# Patient Record
Sex: Female | Born: 1940 | Race: Asian | Hispanic: No | Marital: Married | Smoking: Never smoker
Health system: Southern US, Community
[De-identification: ages and names within clinical notes are randomized; demographics above are authoritative.]

## PROBLEM LIST (undated history)

## (undated) DIAGNOSIS — I10 Essential (primary) hypertension: Secondary | ICD-10-CM

## (undated) HISTORY — PX: APPENDECTOMY: SHX54

---

## 2008-06-05 ENCOUNTER — Emergency Department (HOSPITAL_COMMUNITY): Admission: EM | Admit: 2008-06-05 | Discharge: 2008-06-05 | Payer: Self-pay | Admitting: Emergency Medicine

## 2008-07-16 ENCOUNTER — Emergency Department (HOSPITAL_COMMUNITY): Admission: EM | Admit: 2008-07-16 | Discharge: 2008-07-16 | Payer: Self-pay | Admitting: *Deleted

## 2008-08-09 ENCOUNTER — Emergency Department (HOSPITAL_COMMUNITY): Admission: EM | Admit: 2008-08-09 | Discharge: 2008-08-09 | Payer: Self-pay | Admitting: Emergency Medicine

## 2008-09-21 ENCOUNTER — Emergency Department (HOSPITAL_COMMUNITY): Admission: EM | Admit: 2008-09-21 | Discharge: 2008-09-21 | Payer: Self-pay | Admitting: Emergency Medicine

## 2008-09-24 ENCOUNTER — Encounter: Admission: RE | Admit: 2008-09-24 | Discharge: 2008-09-24 | Payer: Self-pay | Admitting: Pulmonary Disease

## 2010-09-05 LAB — URINALYSIS, ROUTINE W REFLEX MICROSCOPIC
Hgb urine dipstick: NEGATIVE
Protein, ur: NEGATIVE mg/dL
Specific Gravity, Urine: 1.004 — ABNORMAL LOW (ref 1.005–1.030)
Urobilinogen, UA: 0.2 mg/dL (ref 0.0–1.0)

## 2010-09-05 LAB — LIPASE, BLOOD: Lipase: 21 U/L (ref 11–59)

## 2010-09-05 LAB — CBC
HCT: 41.4 % (ref 36.0–46.0)
Hemoglobin: 13.6 g/dL (ref 12.0–15.0)
MCHC: 33 g/dL (ref 30.0–36.0)
MCV: 90.1 fL (ref 78.0–100.0)
RDW: 12.8 % (ref 11.5–15.5)

## 2010-09-05 LAB — POCT I-STAT, CHEM 8
BUN: 10 mg/dL (ref 6–23)
Creatinine, Ser: 0.8 mg/dL (ref 0.4–1.2)
Hemoglobin: 15 g/dL (ref 12.0–15.0)
Potassium: 3 mEq/L — ABNORMAL LOW (ref 3.5–5.1)
Sodium: 139 mEq/L (ref 135–145)

## 2010-09-05 LAB — DIFFERENTIAL
Lymphs Abs: 0.8 10*3/uL (ref 0.7–4.0)
Monocytes Relative: 3 % (ref 3–12)
Neutro Abs: 6.1 10*3/uL (ref 1.7–7.7)
Neutrophils Relative %: 81 % — ABNORMAL HIGH (ref 43–77)

## 2010-09-05 LAB — COMPREHENSIVE METABOLIC PANEL
BUN: 10 mg/dL (ref 6–23)
CO2: 24 mEq/L (ref 19–32)
Calcium: 9.3 mg/dL (ref 8.4–10.5)
Creatinine, Ser: 0.82 mg/dL (ref 0.4–1.2)
GFR calc non Af Amer: >60 mL/min (ref >60)
Glucose, Bld: 194 mg/dL — ABNORMAL HIGH (ref 70–99)
Total Protein: 7.5 g/dL (ref 6.0–8.3)

## 2011-09-11 ENCOUNTER — Encounter (HOSPITAL_COMMUNITY): Payer: Self-pay | Admitting: Emergency Medicine

## 2011-09-11 ENCOUNTER — Emergency Department (HOSPITAL_COMMUNITY)
Admission: EM | Admit: 2011-09-11 | Discharge: 2011-09-12 | Disposition: A | Discharge status: Home / Self Care | Payer: Medicaid Other | Attending: Emergency Medicine | Admitting: Emergency Medicine

## 2011-09-11 DIAGNOSIS — R112 Nausea with vomiting, unspecified: Secondary | ICD-10-CM | POA: Insufficient documentation

## 2011-09-11 DIAGNOSIS — R42 Dizziness and giddiness: Secondary | ICD-10-CM | POA: Insufficient documentation

## 2011-09-11 DIAGNOSIS — R51 Headache: Secondary | ICD-10-CM | POA: Insufficient documentation

## 2011-09-11 HISTORY — DX: Essential (primary) hypertension: I10

## 2011-09-11 LAB — CBC
HCT: 38.9 % (ref 36.0–46.0)
Hemoglobin: 13.1 g/dL (ref 12.0–15.0)
MCH: 29.8 pg (ref 26.0–34.0)
MCHC: 33.7 g/dL (ref 30.0–36.0)
MCV: 88.4 fL (ref 78.0–100.0)
Platelets: 276 10*3/uL (ref 150–400)
RBC: 4.4 MIL/uL (ref 3.87–5.11)
RDW: 12.5 % (ref 11.5–15.5)
WBC: 10.6 10*3/uL — ABNORMAL HIGH (ref 4.0–10.5)

## 2011-09-11 LAB — DIFFERENTIAL
Basophils Absolute: 0 10*3/uL (ref 0.0–0.1)
Basophils Relative: 0 % (ref 0–1)
Eosinophils Absolute: 0 10*3/uL (ref 0.0–0.7)
Eosinophils Relative: 0 % (ref 0–5)
Lymphocytes Relative: 10 % — ABNORMAL LOW (ref 12–46)
Lymphs Abs: 1.1 10*3/uL (ref 0.7–4.0)
Monocytes Absolute: 0.2 10*3/uL (ref 0.1–1.0)
Monocytes Relative: 2 % — ABNORMAL LOW (ref 3–12)
Neutro Abs: 9.3 10*3/uL — ABNORMAL HIGH (ref 1.7–7.7)
Neutrophils Relative %: 88 % — ABNORMAL HIGH (ref 43–77)

## 2011-09-11 LAB — POCT I-STAT, CHEM 8
BUN: 15 mg/dL (ref 6–23)
Creatinine, Ser: 0.6 mg/dL (ref 0.50–1.10)
Potassium: 3.6 mEq/L (ref 3.5–5.1)
Sodium: 139 mEq/L (ref 135–145)
TCO2: 23 mmol/L (ref 0–100)

## 2011-09-11 MED ORDER — DIAZEPAM 5 MG/ML IJ SOLN
5.0000 mg | Freq: Once | INTRAMUSCULAR | Status: AC
  Administered 2011-09-12: 5 mg via INTRAVENOUS
  Filled 2011-09-11: qty 2

## 2011-09-11 MED ORDER — PROMETHAZINE HCL 25 MG/ML IJ SOLN
12.5000 mg | Freq: Once | INTRAMUSCULAR | Status: AC
  Administered 2011-09-12: 12.5 mg via INTRAVENOUS
  Filled 2011-09-11: qty 1

## 2011-09-11 MED ORDER — SODIUM CHLORIDE 0.9 % IV BOLUS (SEPSIS)
1000.0000 mL | Freq: Once | INTRAVENOUS | Status: AC
  Administered 2011-09-11: 1000 mL via INTRAVENOUS

## 2011-09-11 MED ORDER — MECLIZINE HCL 25 MG PO TABS
25.0000 mg | ORAL_TABLET | Freq: Once | ORAL | Status: AC
  Administered 2011-09-12: 25 mg via ORAL
  Filled 2011-09-11: qty 1

## 2011-09-11 NOTE — ED Notes (Addendum)
Patient complaining of headache, dizziness, nausea, and vomiting since 1200 this afternoon; last emesis around 20 minutes ago.  Denies any weakness, but reports fatigue.  Patient does not speak [place]; family members at bedside to translate.  Translator phones at bedside.

## 2011-09-12 LAB — URINE MICROSCOPIC-ADD ON

## 2011-09-12 LAB — URINALYSIS, ROUTINE W REFLEX MICROSCOPIC
Bilirubin Urine: NEGATIVE
Glucose, UA: NEGATIVE mg/dL
Hgb urine dipstick: NEGATIVE
Ketones, ur: NEGATIVE mg/dL
Nitrite: NEGATIVE
Protein, ur: NEGATIVE mg/dL
Specific Gravity, Urine: 1.012 (ref 1.005–1.030)
Urobilinogen, UA: 0.2 mg/dL (ref 0.0–1.0)
pH: 8.5 — ABNORMAL HIGH (ref 5.0–8.0)

## 2011-09-12 LAB — BASIC METABOLIC PANEL
BUN: 14 mg/dL (ref 6–23)
CO2: 21 mEq/L (ref 19–32)
Calcium: 9.6 mg/dL (ref 8.4–10.5)
Chloride: 98 mEq/L (ref 96–112)
Creatinine, Ser: 0.59 mg/dL (ref 0.50–1.10)
GFR calc Af Amer: >90 mL/min (ref >90)
GFR calc non Af Amer: >90 mL/min (ref >90)
Glucose, Bld: 183 mg/dL — ABNORMAL HIGH (ref 70–99)
Potassium: 3.6 mEq/L (ref 3.5–5.1)
Sodium: 136 mEq/L (ref 135–145)

## 2011-09-12 MED ORDER — MECLIZINE HCL 50 MG PO TABS: 25.0000 mg | ORAL_TABLET | Freq: Three times a day (TID) | ORAL | Status: AC | PRN

## 2011-09-12 NOTE — ED Provider Notes (Signed)
History    Linda Wu with vertigo. Fairly acute onset around 1200 today. Sensation that room is spinning. Worse with movement, particularly sitting up. Feels fine when laying still. Felt fine when woke up today. Mild diffuse HA. No neck pain or stiffness. No acute visual changes. Nausea and has vomited several times. No ear pain or tinnitus. No sensation of ear fullness. Denies trauma. No confusion per family. Hx via family members who speaks fluent english.  CSN: [id number]  Arrival date & time 09/11/11  2117   First MD Initiated Contact with Patient 09/11/11 2229      Chief Complaint  Patient presents with  . Dizziness  . Nausea    (Consider location/radiation/quality/duration/timing/severity/associated sxs/prior treatment) HPI  Past Medical History  Diagnosis Date  . Hypertension     Past Surgical History  Procedure Date  . Appendectomy     History reviewed. No pertinent family history.  History  Substance Use Topics  . Smoking status: Never Smoker   . Smokeless tobacco: Not on file  . Alcohol Use: No    OB History    Grav Para Term Preterm Abortions TAB SAB Ect Mult Living                  Review of Systems   Review of symptoms negative unless otherwise noted in HPI.   Allergies  Review of patient's allergies indicates no known allergies.  Home Medications   Current Outpatient Rx  Name Route Sig Dispense Refill  . IBUPROFEN 200 MG PO TABS Oral Take 200 mg by mouth every 6 (six) hours as needed. For pain.    Marland Kitchen MECLIZINE HCL 50 MG PO TABS Oral Take 0.5 tablets (25 mg total) by mouth 3 (three) times daily as needed. 30 tablet 0    BP 137/65  Pulse 95  Temp(Src) 97.9 F (36.6 C) (Oral)  Resp 20  SpO2 99%  Physical Exam  Nursing note and vitals reviewed. Constitutional: She appears well-developed and well-nourished. No distress.  HENT:  Head: Normocephalic and atraumatic.  Right Ear: External ear normal.  Left Ear: External ear normal.    Mouth/Throat: Oropharynx is clear and moist.       External auditory canals are clear. TMs are unremarkable. No nystagmus appreciated with Dix-Hallpike maneuvers, but symptomatically much worse particularly with patient looking to the left. Subjectively less symptoms with repeat testing  Eyes: Conjunctivae are normal. Pupils are equal, round, and reactive to light. Right eye exhibits no discharge. Left eye exhibits no discharge.  Neck: Normal range of motion. Neck supple.  Cardiovascular: Normal rate, regular rhythm and normal heart sounds.  Exam reveals no gallop and no friction rub.   No murmur heard. Pulmonary/Chest: Effort normal and breath sounds normal. No respiratory distress.  Abdominal: Soft. She exhibits no distension. There is no tenderness.  Musculoskeletal: She exhibits no edema and no tenderness.  Lymphadenopathy:    She has no cervical adenopathy.  Neurological: She is alert. No cranial nerve deficit. She exhibits normal muscle tone. Coordination normal.       Normal  finger to nose and heel to shin testing bilaterally. Good Rapid alternating finger movements.  Skin: Skin is warm and dry.  Psychiatric: She has a normal mood and affect. Her behavior is normal. Thought content normal.    ED Course  Procedures (including critical care time)  Labs Reviewed  URINALYSIS, ROUTINE W REFLEX MICROSCOPIC - Abnormal; Notable for the following:    pH 8.5 (*)    Leukocytes,  UA SMALL (*)    All other components within normal limits  CBC - Abnormal; Notable for the following:    WBC 10.6 (*)    All other components within normal limits  DIFFERENTIAL - Abnormal; Notable for the following:    Neutrophils Relative 88 (*)    Neutro Abs 9.3 (*)    Lymphocytes Relative 10 (*)    Monocytes Relative 2 (*)    All other components within normal limits  BASIC METABOLIC PANEL - Abnormal; Notable for the following:    Glucose, Bld 183 (*)    All other components within normal limits  POCT  I-STAT, CHEM 8 - Abnormal; Notable for the following:    Glucose, Bld 203 (*)    Calcium, Ion 1.11 (*)    All other components within normal limits  URINE MICROSCOPIC-ADD ON   No results found.   1. Vertigo    EKG:  Rhythm: nsr Rate: 87 Axis: normal Intervals: normal ST segments: NS ST changes. t wave flattening anterolaterally.    MDM  Linda Wu with vertigo. Suspect peripheral etiology with severity of symptoms, positional component, fatiguability and nonfocal neurological exam. Possible BPPV. Consider cerebellar infarct/bleed particularly with mild HA. Cerebellar testing normal. Afebrile. No tinnitus. No use of potentially ototoxic meds such as aminoglycosides, lasix, etc. Doubt perilympathic fistula given no hx of recent or remote head trauma and negative hennebert's sign. No ear fullness or tinnitus to suggest meniere's.        Raeford Razor, MD 09/12/11 [phone]

## 2011-09-12 NOTE — ED Notes (Signed)
Patient given discharge paperwork; went over discharge instructions with patient (family preferred to be translator over Tax inspector).  Patient instructed to take Antivert as directed, to follow up with primary care physician or referrals if symptoms persist, and to return to the ED for new, worsening, or concerning symptoms.

## 2011-09-12 NOTE — Discharge Instructions (Signed)
Ch?ng Chng M?t (Vertigo) Chng m?t c ngh?a l b?n c?m th?y nh? b?n hay m?i th? xung quanh b?n ?ang di chuy?n khi th?c t? khng ph?i nh? v?y. Chng m?t c th? nguy hi?m n?u n x?y ra khi b?n ?ang ? n?i lm vi?c, li xe ho?c th?c hi?n cc ho?t ??ng kh kh?n.  NGUYN NHN  Chng m?t x?y ra khi c s? xung ??t c?a cc tn hi?u ???c g?i ln no t? h? th?ng th? gic v h? th?ng c?m gic trong c? th?. C nhi?u nguyn nhn khc nhau gy chng m?t, bao g?m:   Nhi?m trng, ??c bi?t l ? tai trong.   Ph?n ?ng x?u v?i m?t lo?i thu?c ho?c l?m d?ng r??u v thu?c men.   Cai ma ty ho?c r??u.   Thay ??i v? tr nhanh, ch?ng h?n nh? n?m xu?ng ho?c l?n mnh trn gi??ng.   ?au n?a ??u.   Dng mu ln no gi?m.   p l?c trong no t?ng sau ch?n th??ng ??u, nhi?m trng, kh?i u ho?c ch?y mu.  TRI?U CH?NG  N c th? c v? nh? l th? gi?i ?ang quay cu?ng ho?c b?n ?ang r?i xu?ng ??t. B?i v s? cn b?ng c?a b?n b? ??o l?n, chng m?t c th? khi?n b?n c?m th?y kh ch?u ? d? dy (bu?n nn) v nn m?a. B?n c th? c c? ??ng m?t ngoi  mu?n (gi?t c?u m?t). CH?N ?ON  Chng m?t th??ng ???c ch?n ?on b?ng cch khm tr?c ti?p. N?u khng xc ??nh ???c nguyn nhn gy chng m?t, chuyn gia ch?m [place] y t? c th? th?c hi?n cc xt nghi?m hnh ?nh, ch?ng h?n nh? ch?p MRI (t?o ?nh c?ng h??ng t?).  ?I?U TR?  H?u h?t cc tr??ng h?p chng m?t t? chng tiu tan m khng c?n ?i?u tr?Marland Kitchen Ty thu?c vo nguyn nhn, chuyn gia ch?m [place] y t? c th? k m?t s? thu?c nh?t ??nh. N?u chng m?t c lin quan ??n cc v?n ?? v? v? tr c? th?, chuyn gia ch?m [place] y t? c th? ?? xu?t cc ??ng tc ho?c ph??ng php ?? kh?c ph?c v?n ?? ny. Trong m?t s? tr??ng h?p hi?m g?p, n?u chng m?t do m?t s? v?n ?? v? tai trong, b?n c th? c?n ph?u thu?t.  H??NG D?N CH?M [place] T?I NH  Lm theo h??ng d?n c?a bc s?[place] li xe.   Trnh v?n hnh my mc n?ng.   Trnh th?c hi?n b?t c? nhi?m v? no c th? gy nguy hi?m cho b?n ho?c nh?ng ng??i khc trong tnh  tr?ng chng m?t.   Ni cho chuyn gia ch?m [place] y t? bi?t n?u b?n nh?n th?y lo?i thu?c no ? d??ng nh? l nguyn nhn gy chng m?t cho b?n. M?t s? thu?c ???c dng ?? ?i?u tr? tnh tr?ng chng m?t th?c s? c th? lm cho chng t?i t? h?n ? m?t s? ng??i.  HY NGAY L?P T?C THAM V?N V?I CHUYN GIA Y T? N?U:  Thu?c c v? nh? khng c tc d?ng lm h?t c?n chng m?t ho?c lm b?nh n?ng thm.   B?n c v?n ?? v? ni chuy?n, ?i b?, ?m y?u ho?c vi?c s? d?ng cnh tay, bn tay hay chn.   B?n b? ?au ??u n?ng.   Bu?n nn ho?c nn lin t?c ho?c n?ng h?n.   B?n b? thay ??i th? gic.   Thnh vin trong gia ?nh nh?n th?y cc thay ??i hnh vi.  Tnh tr?ng c?a b?n tr? nn n?ng h?n.  ??M B?O B?N:   Hi?u cc h??ng d?n ny.   S? theo di tnh tr?ng c?a mnh.   S? yu c?u tr? gip ngay l?p t?c n?u b?n c?m th?y khng kh?e ho?c tnh tr?ng tr? nn t?i h?n.  Document Released: 05/07/2005 Document Revised: 04/26/2011 [facility] Patient Information 2012 [place], [place].  RESOURCE GUIDE  Dental Problems  Patients with Medicaid: [facility] [phone]                                           [phone] Phone:  [phone]                                                  Phone:  [phone]  If unable to pay or uninsured, contact:  [facility] or [facility]. to become qualified for the adult dental clinic.  Chronic Pain Problems Contact [facility]  [phone] Patients need to be referred by their primary care doctor.  Insufficient Money for Medicine Contact United Way:  call "211" or [facility] [phone].  No Primary Care Doctor Call [facility]  [phone] Other agencies that provide inexpensive medical care    [facility]  [phone]    [facility]  [phone]    [facility]  [phone]    [facility]  [phone]    [facility]  [phone]     [facility]  [phone]  Psychological Services [facility]  [phone] [facility] Services  [phone] [facility]   [phone] (emergency services [phone])  Substance Abuse Resources [facility]  [phone] [facility] [phone] [facility] [place] [phone] Floydene Flock [phone] Residential & Outpatient Substance Abuse Program  [phone]  Abuse/Neglect [facility] Child Abuse Hotline [phone] [facility] Child Abuse Hotline [phone] (After Hours)  Emergency Shelter [facility] [phone]  Maternity Homes [facility] at the [place] [facility] [place] [phone] Rebeca Alert Services [phone]  MRSA Hotline #:   (754)156-0495    [facility] Resources  [facility]     [employer]                          [facility] Dept. [address] [place]                       [address]      [address]  [place]                                                Cristobal Goldmann Phone:  [phone]                                   Phone:  [phone]                 Phone:  [phone]  [facility] Phone:  [phone]  [facility] Child Abuse Hotline 208-464-6026 [phone] (After Hours)

## 2015-09-17 ENCOUNTER — Encounter (HOSPITAL_COMMUNITY): Payer: Self-pay | Admitting: Emergency Medicine

## 2015-09-17 ENCOUNTER — Emergency Department (HOSPITAL_COMMUNITY)
Admission: EM | Admit: 2015-09-17 | Discharge: 2015-09-18 | Disposition: A | Discharge status: Home / Self Care | Payer: Medicaid Other | Attending: Emergency Medicine | Admitting: Emergency Medicine

## 2015-09-17 DIAGNOSIS — I10 Essential (primary) hypertension: Secondary | ICD-10-CM | POA: Diagnosis not present

## 2015-09-17 DIAGNOSIS — F419 Anxiety disorder, unspecified: Secondary | ICD-10-CM | POA: Diagnosis not present

## 2015-09-17 DIAGNOSIS — H9192 Unspecified hearing loss, left ear: Secondary | ICD-10-CM | POA: Diagnosis not present

## 2015-09-17 DIAGNOSIS — E785 Hyperlipidemia, unspecified: Secondary | ICD-10-CM | POA: Diagnosis not present

## 2015-09-17 DIAGNOSIS — R42 Dizziness and giddiness: Secondary | ICD-10-CM | POA: Diagnosis not present

## 2015-09-17 DIAGNOSIS — M79605 Pain in left leg: Secondary | ICD-10-CM | POA: Insufficient documentation

## 2015-09-17 DIAGNOSIS — Z79899 Other long term (current) drug therapy: Secondary | ICD-10-CM | POA: Diagnosis not present

## 2015-09-17 DIAGNOSIS — M542 Cervicalgia: Secondary | ICD-10-CM | POA: Diagnosis not present

## 2015-09-17 LAB — CBC
HCT: 41.3 % (ref 36.0–46.0)
Hemoglobin: 13.4 g/dL (ref 12.0–15.0)
MCH: 29.9 pg (ref 26.0–34.0)
MCHC: 32.4 g/dL (ref 30.0–36.0)
MCV: 92.2 fL (ref 78.0–100.0)
PLATELETS: 246 10*3/uL (ref 150–400)
RBC: 4.48 MIL/uL (ref 3.87–5.11)
RDW: 12.9 % (ref 11.5–15.5)
WBC: 8.4 10*3/uL (ref 4.0–10.5)

## 2015-09-17 LAB — URINALYSIS, ROUTINE W REFLEX MICROSCOPIC
BILIRUBIN URINE: NEGATIVE
Glucose, UA: NEGATIVE mg/dL
Hgb urine dipstick: NEGATIVE
KETONES UR: 15 mg/dL — AB
LEUKOCYTES UA: NEGATIVE
NITRITE: NEGATIVE
PROTEIN: 30 mg/dL — AB
Specific Gravity, Urine: 1.017 (ref 1.005–1.030)
pH: 7.5 (ref 5.0–8.0)

## 2015-09-17 LAB — URINE MICROSCOPIC-ADD ON: WBC UA: NONE SEEN WBC/hpf (ref 0–5)

## 2015-09-17 LAB — BASIC METABOLIC PANEL
Anion gap: 10 (ref 5–15)
BUN: 12 mg/dL (ref 6–20)
CALCIUM: 9.4 mg/dL (ref 8.9–10.3)
CO2: 25 mmol/L (ref 22–32)
CREATININE: 0.79 mg/dL (ref 0.44–1.00)
Chloride: 105 mmol/L (ref 101–111)
GFR calc Af Amer: >60 mL/min (ref >60)
Glucose, Bld: 180 mg/dL — ABNORMAL HIGH (ref 65–99)
POTASSIUM: 4.4 mmol/L (ref 3.5–5.1)
SODIUM: 140 mmol/L (ref 135–145)

## 2015-09-17 NOTE — ED Notes (Signed)
Patient arrives with family. Patient only speaks viatnamese. Today patient developed symptoms of dizziness and nausea. Family checked BP today and found that SBP was in the 160s. Patient takes BP medications and is usually well regulated.

## 2015-09-18 ENCOUNTER — Emergency Department (HOSPITAL_COMMUNITY): Payer: Medicaid Other

## 2015-09-18 LAB — CBG MONITORING, ED: GLUCOSE-CAPILLARY: 100 mg/dL — AB (ref 65–99)

## 2015-09-18 MED ORDER — SODIUM CHLORIDE 0.9 % IV BOLUS (SEPSIS)
1000.0000 mL | Freq: Once | INTRAVENOUS | Status: AC
Start: 2015-09-18 — End: 2015-09-18
  Administered 2015-09-18: 1000 mL via INTRAVENOUS

## 2015-09-18 MED ORDER — LORAZEPAM 2 MG/ML IJ SOLN
1.0000 mg | Freq: Once | INTRAMUSCULAR | Status: AC
  Administered 2015-09-18: 1 mg via INTRAVENOUS
  Filled 2015-09-18: qty 1

## 2015-09-18 MED ORDER — ONDANSETRON HCL 4 MG/2ML IJ SOLN
4.0000 mg | Freq: Once | INTRAMUSCULAR | Status: AC
  Administered 2015-09-18: 4 mg via INTRAVENOUS
  Filled 2015-09-18: qty 2

## 2015-09-18 MED ORDER — IOPAMIDOL (ISOVUE-370) INJECTION 76%
INTRAVENOUS | Status: AC
  Administered 2015-09-18: 80 mL
  Filled 2015-09-18: qty 100

## 2015-09-18 MED ORDER — MECLIZINE HCL 25 MG PO TABS
25.0000 mg | ORAL_TABLET | Freq: Once | ORAL | Status: AC
  Administered 2015-09-18: 25 mg via ORAL
  Filled 2015-09-18: qty 1

## 2015-09-18 MED ORDER — ONDANSETRON 4 MG PO TBDP: 4.0000 mg | ORAL_TABLET | Freq: Three times a day (TID) | ORAL | Status: AC | PRN

## 2015-09-18 MED ORDER — MECLIZINE HCL 25 MG PO TABS: 25.0000 mg | ORAL_TABLET | Freq: Three times a day (TID) | ORAL | Status: AC | PRN

## 2015-09-18 NOTE — ED Notes (Signed)
CBG 100 

## 2015-09-18 NOTE — ED Provider Notes (Signed)
CSN: [id number]     Arrival date & time 09/17/15  2134 History  By signing my name below, I, [facility], attest that this documentation has been prepared under the direction and in the presence of Derwood Kaplan, MD. Electronically Signed: Randell Patient, ED Scribe. 09/18/2015. 3:53 AM.   Chief Complaint  Patient presents with  . Dizziness  . Nausea   The history is provided by a relative. The history is limited by a language barrier. No language interpreter was used.  HPI Comments: Linda Wu is a 75 y.o. female with an hx of HTN, HLN, anxiety, and permanent left-sided hearing loss who presents to the Emergency Department complaining of constant, unchanging dizziness that she describes as room-spinning onset 5 hours ago. Her grandson-in-law provided the history. He states that she was in the kitchen cutting up meat when dizziness began. He reports aching left leg pain and emesis and posterior neck pain since arrival in the ED tonight. He notes that she has been unable to open her eyes or ambulate without assistance secondary to dizziness. Dizziness has no alleviating factors but is worse with sitting up and movement of the head. He reports similar symptoms in the past that she associated with HTN. Denies hx of strokes, recent medication changes, recent falls, trauma, and accidents. Denies numbness, neck stiffness, hearing loss different from baselines, tinnitus.  Past Medical History  Diagnosis Date  . Hypertension    Past Surgical History  Procedure Laterality Date  . Appendectomy     History reviewed. No pertinent family history. Social History  Substance Use Topics  . Smoking status: Never Smoker   . Smokeless tobacco: None  . Alcohol Use: No   OB History    No data available     Review of Systems A complete 10 system review of systems was obtained and all systems are negative except as noted in the HPI and PMH.   Allergies  Review of patient's allergies indicates  no known allergies.  Home Medications   Prior to Admission medications   Medication Sig Start Date End Date Taking? Authorizing Provider  ibuprofen (ADVIL,MOTRIN) 200 MG tablet Take 200-400 mg by mouth every 6 (six) hours as needed for moderate pain.    Yes Historical Provider, MD  pravastatin (PRAVACHOL) 80 MG tablet Take 80 mg by mouth daily.   Yes Historical Provider, MD  traZODone (DESYREL) 100 MG tablet Take 100 mg by mouth at bedtime.   Yes Historical Provider, MD   BP 144/74 mmHg  Pulse 73  Temp(Src) 98.1 F (36.7 C) (Oral)  Resp 12  SpO2 99% Physical Exam  Constitutional: She is oriented to person, place, and time. She appears well-developed and well-nourished.  HENT:  Head: Normocephalic.  Right Ear: Tympanic membrane normal.  Left Ear: Tympanic membrane normal. Decreased hearing is noted.  Left-sided hearing loss. Normal TMs.  Eyes: EOM are normal. Pupils are equal, round, and reactive to light.  EOM intact. No nystagmus. PERRL.  Neck: Normal range of motion. No JVD present.  No neck stiffness or meningismus.  Cardiovascular: Normal rate and regular rhythm.   2+ equal radial pulses bilaterally.  Pulmonary/Chest: Effort normal.  Abdominal: She exhibits no distension.  Musculoskeletal: Normal range of motion.  Neurological: She is alert and oriented to person, place, and time.  Cranial nerves II-XII intact. Cerebellar exam reveals no dysmetria; heel to shin is normal as well. Sensory exam of upper and lower extremities equal and normal. On HINTS exam, head-impulse is normal, there  is no nystagmus and test of skew is normal.  Psychiatric: She has a normal mood and affect.  Nursing note and vitals reviewed.   ED Course  Procedures   DIAGNOSTIC STUDIES: Oxygen Saturation is 98% on RA, normal by my interpretation.    COORDINATION OF CARE: 1:10 AM Discussed results of labs and imaging. Will order head CT and Zofran. Discussed treatment plan with pt at bedside and pt  agreed to plan.   Labs Review Labs Reviewed  BASIC METABOLIC PANEL - Abnormal; Notable for the following:    Glucose, Bld 180 (*)    All other components within normal limits  URINALYSIS, ROUTINE W REFLEX MICROSCOPIC (NOT [facility]) - Abnormal; Notable for the following:    APPearance TURBID (*)    Ketones, ur 15 (*)    Protein, ur 30 (*)    All other components within normal limits  URINE MICROSCOPIC-ADD ON - Abnormal; Notable for the following:    Squamous Epithelial / LPF 0-5 (*)    Bacteria, UA MANY (*)    All other components within normal limits  CBG MONITORING, ED - Abnormal; Notable for the following:    Glucose-Capillary 100 (*)    All other components within normal limits  CBC    Imaging Review No results found. I have personally reviewed and evaluated these images and lab results as part of my medical decision-making.   EKG Interpretation   Date/Time:  Saturday September 17 2015 22:01:18 EDT Ventricular Rate:  72 PR Interval:  144 QRS Duration: 80 QT Interval:  414 QTC Calculation: 453 R Axis:   78 Text Interpretation:  Normal sinus rhythm RSR' or QR pattern in V1  suggests right ventricular conduction delay Septal infarct , age  undetermined Abnormal ECG No old tracing to compare No significant change  since last tracing Confirmed by Rhunette CroftNANAVATI, MD, Janey GentaANKIT [phone]([id number] on 09/18/2015  4:19:56 AM      MDM   Final diagnoses:  None    I personally performed the services described in this documentation, which was scribed in my presence. The recorded information has been reviewed and is accurate.  Pt comes in with cc of vertigo.  DDx includes: Central vertigo:  Tumor  Stroke  ICH  Vertebrobasilar TIA  Peripheral Vertigo:  BPPV  Vestibular neuritis  Meniere disease  Migrainous vertigo  Ear Infection    Pt comes in with cc of vertigo. New symptom. No other focal neuro deficits. HINTs exam reveals no nystagmus, normal head impulse test and no skew. So we  can't pin point and state that the symptoms are due to peripheral etiology. Moreover, pt is c/o neck pain - so we will get CT angio head and neck - r/o dissection.  Pt given ativan and meclizine - feeling better (@4  am), but not completely better. Post CT we will reassess, might need MRI.   Derwood KaplanAnkit Reshma Hoey, MD 09/18/15 [phone]

## 2015-09-18 NOTE — Discharge Instructions (Signed)
We saw you in the ER for the vertigo and dizziness.  All the results in the ER are normal, labs and imaging.  The workup in the ER is not complete, and is limited to screening for life threatening and emergent conditions only, so please see a primary care doctor for further evaluation.  If your symptoms get worse, return to the ER immediately. Be careful with walking.  Take the medicine prescribed and do the exercises we provided.   Ch?ng Chng M?t (Vertigo) Chng m?t c ngh?a l b?n c?m th?y nh? b?n hay m?i th? xung quanh b?n ?ang di chuy?n khi th?c t? khng ph?i nh? v?y. Chng m?t c th? nguy hi?m n?u n x?y ra khi b?n ?ang ? n?i lm vi?c, li xe ho?c th?c hi?n cc ho?t ??ng kh kh?n. NGUYN NHN Chng m?t x?y ra khi c s? xung ??t c?a cc tn hi?u ???c g?i ln no t? h? th?ng th? gic v h? th?ng c?m gic trong c? th?. C nhi?u nguyn nhn khc nhau gy chng m?t, bao g?m: 1. Nhi?m trng, ??c bi?t l ? tai trong. 2. Ph?n ?ng x?u v?i m?t lo?i thu?c ho?c l?m d?ng r??u v thu?c men. 3. Cai ma ty ho?c r??u. 4. Thay ??i t? th? nhanh, ch?ng h?n nh? n?m xu?ng ho?c l?n mnh trn gi??ng. 5. ?au n?a ??u. 6. L?u l??ng mu ln no gi?m. 7. p l?c trong no t?ng sau ch?n th??ng ??u, nhi?m trng, kh?i u ho?c ch?y mu. TRI?U CH?NG B?n c th? c?m th?y c v? nh? l th? gi?i ?ang quay cu?ng ho?c b?n ?ang r?i xu?ng ??t. B?i v s? cn b?ng c?a b?n b? ??o l?n, chng m?t c th? khi?n b?n bu?n nn v nn m?a. B?n c th? c c? ??ng m?t ngoi  mu?n (gi?t c?u m?t). CH?N ?ON Chng m?t th??ng ???c ch?n ?on b?ng cch khm th?c th?. N?u khng xc ??nh ???c nguyn nhn gy chng m?t, chuyn gia ch?m [place] s?c kh?e c th? th?c hi?n cc xt nghi?m hnh ?nh, ch?ng h?n nh? ch?p MRI (ch?p c?ng h??ng t?). ?I?U TR? H?u h?t cc tr??ng h?p chng m?t t? chng h?t m khng c?n ?i?u tr?Marland Kitchen Ty thu?c vo nguyn nhn, chuyn gia ch?m [place] s?c kh?e c th? k m?t s? thu?c nh?t ??nh. N?u chng m?t c lin quan ??n cc v?n ?? v? v?  tr c? th?, chuyn gia ch?m [place] s?c kh?e c th? ?? xu?t cc ??ng tc ho?c ph??ng php ?? kh?c ph?c v?n ?? ny. Trong m?t s? tr??ng h?p hi?m g?p, n?u chng m?t do m?t s? v?n ?? v? tai trong, b?n c th? c?n ph?u thu?t. H??NG D?N CH?M [place] T?I NH  Lm theo h??ng d?n c?a bc s?Hessie Diener li xe.  Trnh v?n hnh my mc n?ng.  Trnh th?c hi?n b?t c? nhi?m v? no c th? gy nguy hi?m cho b?n ho?c nh?ng ng??i khc trong tnh tr?ng chng m?t.  Ni cho chuyn gia ch?m [place] s?c kh?e bi?t n?u b?n nh?n th?y lo?i thu?c no ? d??ng nh? l nguyn nhn gy chng m?t cho b?n. M?t s? thu?c ???c dng ?? ?i?u tr? tnh tr?ng chng m?t th?c s? c th? lm cho chng t?i t? h?n ? m?t s? ng??i. HY NGAY L?P T?C ?I KHM N?U:  Thu?c c v? nh? khng c tc d?ng lm h?t c?n chng m?t ho?c lm b?nh n?ng thm.  B?n c v?n ?? v? ni chuy?n, ?i b?, ?m y?u ho?c  vi?c s? d?ng cnh tay, bn tay hay chn.  B?n b? ?au ??u r?t nhi?u.  Bu?n nn ho?c nn lin t?c ho?c n?ng h?n.  B?n b? thay ??i th? gic.  Thnh vin trong gia ?nh nh?n th?y cc thay ??i hnh vi.  Tnh tr?ng c?a b?n tr? nn n?ng h?n. ??M B?O B?N:  Hi?u cc h??ng d?n ny.  S? theo di tnh tr?ng c?a mnh.  S? yu c?u tr? gip ngay l?p t?c n?u b?n c?m th?y khng ?? ho?c tnh tr?ng tr?m tr?ng h?n.   Thng tin ny khng nh?m m?c ?ch thay th? cho l?i khuyn m chuyn gia ch?m [place] s?c kh?e ni v?i qu v?. Hy b?o ??m qu v? ph?i th?o lu?n b?t k? v?n ?? g m qu v? c v?i chuyn gia ch?m [place] s?c kh?e c?a qu v?.   Document Released: 05/07/2005 Document Revised: 01/07/2013 Elsevier Interactive Patient Education 2016 [place] m?t (Dizziness) Chng m?t l m?t v?n ?? ph? bi?n. ? l c?m gic khng ?n ??nh ho?c chong vng. Qu v? c th? c?m th?y nh? s?p ng?t. Chng m?t c th? d?n ??n ch?n th??ng n?u qu v? v?p ho?c t ng. B?t k? ai c?ng c th? b? chng m?t nh?ng chng m?t ph? bi?n h?n ? ng??i l?n. Tnh tr?ng ny c th? do m?t s? nguyn nhn, bao  g?m dng thu?c, b? m?t n??c, ho?c b? ?m. H??NG D?N CH?M [place] T?I NH Th?c hi?n nh?ng b??c sau c th? gip gi?m tnh tr?ng ny. ?n v u?ng 8. U?ng ?? n??c ?? gi? cho n??c ti?u trong ho?c c mu vng nh?t. Vi?c ny gi? cho c? th? qu v? khng b? m?t n??c. C? g?ng u?ng nhi?u dung d?ch trong su?t, ch?ng h?n nh? n??c. 9. Khng u?ng r??u. 10. H?n ch? dng caffein n?u c ch? d?n c?a chuyn gia ch?m [place] s?c kh?e. 11. H?n ch? dng mu?i n?u c ch? d?n c?a chuyn gia ch?m [place] s?c kh?e. Ho?t ??ng  Trnh cc ??ng tc nhanh.  T? ??ng d?y th?t ch?m v v?ng ch?c cho ??n khi qu v? c?m th?y khng c v?n ?? g.  Vo bu?i sng, ??u tin l ng?i d?y ? bn c?nh gi??ng. Khi qu v? c?m th?y ?n ? t? th? ny, hy ??ng d?y ch?m d?y trong khi n?m l?y m?t ci g ? cho ??n khi qu v? th?y th?ng b?ng.  C? ??ng chn th??ng xuyn n?u qu v? c?n ??ng ? m?t n?i trong m?t th?i gian di. Co v th? gin cc c? b?p ? chn qu v? khi ??ng.  Khng li xe ho?c v?n hnh my mc n?ng n?u qu v? c?m th?y chng m?t.  Trnh ci ng??i n?u qu v? c?m th?y chng m?t. ?? cc v?t d?ng trong nh sao cho c th? d? dng v?i t?i chng m khng c?n ci ng??i. L?i s?ng  Khng s? d?ng b?t c? cc s?n ph?m thu?c l no, bao g?m thu?c l d?ng ht, thu?c l d?ng nhai ho?c thu?c l ?i?n t?. N?u qu v? c?n gip ?? ?? cai thu?c, hy h?i chuyn gia ch?m [place] s?c kh?e.  C? g?ng gi?m c?ng th?ng, ch?ng h?n nh? t?p yoga ho?c thi?n. Ni v?i chuyn gia ch?m [place] s?c kh?e n?u qu v? c?n gip ??. H??ng d?n chung  Theo di tnh tr?ng chng m?t c?a qu v? ?? pht hi?n b?t k? thay ??i no.  Ch? s? d?ng thu?c theo ch? d?n c?a chuyn gia ch?m  [place] s?c kh?e. Ni chuy?n v?i chuyn gia ch?m [place] s?c kh?e n?u qu v? ngh? r?ng tnh tr?ng chng m?t l do vi?c qu v? dng thu?c gy ra.  Ni v?i m?t ng??i b?n ho?c m?t ng??i trong gia ?nh r?ng qu v? ?ang b? chng m?t. N?u h? nh?n th?y qu v? c thay ??i hnh vi, hy b?o h? g?i cho chuyn gia ch?m [place] s?c kh?e.  Tun th?  t?t c? cc cu?c h?n khm l?i theo ch? d?n c?a chuyn gia ch?m [place] s?c kh?e. ?i?u ny c vai tr quan tr?ng. ?I KHM N?U:  Tnh tr?ng chng m?t khng m?t ?i.  Qu v? b? hoa m?t ho?c chng m?t n?ng h?n.  Qu v? c?m th?y bu?n nn.  Qu v? b? gi?m thnh l?c.  Qu v? c cc tri?u ch?ng m?i.  Qu v? khng ??ng v?ng ???c ho?c qu v? c?m th?y nh? c?n phng ?ang quay. NGAY L?P T?C ?I KHM N?U:  Qu v? nn ho?c tiu ch?y v khng th? ?n ho?c u?ng ???c th? g.  Qu v? g?p kh kh?n trong vi?c ni, ?i b?, nu?t ho?c s? d?ng cnh tay, bn tay ho?c chn.  Qu v? c?m th?y y?u ton thn.  Qu v? suy ngh? khng r rng ho?c kh ??t cu. M?t ng??i b?n ho?c ng??i trong gia ?nh c th? nh?n th?y ?i?u ny.  Qu v? b? ?au ng?c, ?au b?ng, kh th? ho?c v m? hi.  Th? l?c c?a qu v? thay ??i.  Qu v? th?y b?t k? ch? ch?y mu no.  Qu v? b? ?au ??u.  Qu v? b? ?au c? ho?c c? c?ng.  Qu v? b? s?t.   Thng tin ny khng nh?m m?c ?ch thay th? cho l?i khuyn m chuyn gia ch?m [place] s?c kh?e ni v?i qu v?. Hy b?o ??m qu v? ph?i th?o lu?n b?t k? v?n ?? g m qu v? c v?i chuyn gia ch?m [place] s?c kh?e c?a qu v?.   Document Released: 04/26/2011 Document Revised: 09/21/2014 Elsevier Interactive Patient Education 2016 [employer]. [employer] Self-Care WHAT IS THE EPLEY MANEUVER? The Epley maneuver is an exercise you can do to relieve symptoms of benign paroxysmal positional vertigo (BPPV). This condition is often just referred to as vertigo. BPPV is caused by the movement of tiny crystals (canaliths) inside your inner ear. The accumulation and movement of canaliths in your inner ear causes a sudden spinning sensation (vertigo) when you move your head to certain positions. Vertigo usually lasts about 30 seconds. BPPV usually occurs in just one ear. If you get vertigo when you lie on your left side, you probably have BPPV in your left ear. Your health care provider can tell you which ear is involved.    BPPV may be caused by a head injury. Many people older than 50 get BPPV for unknown reasons. If you have been diagnosed with BPPV, your health care provider may teach you how to do this maneuver. BPPV is not life threatening (benign) and usually goes away in time.  WHEN SHOULD I PERFORM THE EPLEY MANEUVER? You can do this maneuver at home whenever you have symptoms of vertigo. You may do the Epley maneuver up to 3 times a day until your symptoms of vertigo go away. HOW SHOULD I DO THE EPLEY MANEUVER? 12. Sit on the edge of a bed or table with your back straight. Your legs should be extended or hanging over the edge of the bed or table.  13. Turn your head halfway toward the affected ear.  14. Lie backward quickly with your head turned until you are lying flat on your back. You may want to position a pillow under your shoulders.  15. Hold this position for 30 seconds. You may experience an attack of vertigo. This is normal. Hold this position until the vertigo stops. 16. Then turn your head to the opposite direction until your unaffected ear is facing the floor.  17. Hold this position for 30 seconds. You may experience an attack of vertigo. This is normal. Hold this position until the vertigo stops. 18. Now turn your whole body to the same side as your head. Hold for another 30 seconds.  19. You can then sit back up. ARE THERE RISKS TO THIS MANEUVER? In some cases, you may have other symptoms (such as changes in your vision, weakness, or numbness). If you have these symptoms, stop doing the maneuver and call your health care provider. Even if doing these maneuvers relieves your vertigo, you may still have dizziness. Dizziness is the sensation of light-headedness but without the sensation of movement. Even though the Epley maneuver may relieve your vertigo, it is possible that your symptoms will return within 5 years. WHAT SHOULD I DO AFTER THIS MANEUVER? After doing the Epley maneuver, you can  return to your normal activities. Ask your doctor if there is anything you should do at home to prevent vertigo. This may include:  Sleeping with two or more pillows to keep your head elevated.  Not sleeping on the side of your affected ear.  Getting up slowly from bed.  Avoiding sudden movements during the day.  Avoiding extreme head movement, like looking up or bending over.  Wearing a cervical collar to prevent sudden head movements. WHAT SHOULD I DO IF MY SYMPTOMS GET WORSE? Call your health care provider if your vertigo gets worse. Call your provider right way if you have other symptoms, including:   Nausea.  Vomiting.  Headache.  Weakness.  Numbness.  Vision changes.   This information is not intended to replace advice given to you by your health care provider. Make sure you discuss any questions you have with your health care provider.   Document Released: 05/12/2013 Document Reviewed: 05/12/2013 Elsevier Interactive Patient Education [employer].

## 2015-09-18 NOTE — ED Notes (Signed)
Patient ambulated unassisted around department without difficult.

## 2015-09-18 NOTE — ED Notes (Signed)
Patient not in room at this time.

## 2015-09-18 NOTE — ED Notes (Signed)
IV team at the bedside. 

## 2016-10-24 ENCOUNTER — Emergency Department (HOSPITAL_COMMUNITY)
Admission: EM | Admit: 2016-10-24 | Discharge: 2016-10-24 | Disposition: A | Discharge status: Home / Self Care | Payer: Medicaid Other | Attending: Emergency Medicine | Admitting: Emergency Medicine

## 2016-10-24 ENCOUNTER — Encounter (HOSPITAL_COMMUNITY): Payer: Self-pay | Admitting: Emergency Medicine

## 2016-10-24 DIAGNOSIS — R112 Nausea with vomiting, unspecified: Secondary | ICD-10-CM | POA: Diagnosis present

## 2016-10-24 DIAGNOSIS — Y939 Activity, unspecified: Secondary | ICD-10-CM | POA: Insufficient documentation

## 2016-10-24 DIAGNOSIS — Y929 Unspecified place or not applicable: Secondary | ICD-10-CM | POA: Insufficient documentation

## 2016-10-24 DIAGNOSIS — T46905A Adverse effect of unspecified agents primarily affecting the cardiovascular system, initial encounter: Secondary | ICD-10-CM | POA: Diagnosis not present

## 2016-10-24 DIAGNOSIS — T887XXA Unspecified adverse effect of drug or medicament, initial encounter: Secondary | ICD-10-CM | POA: Diagnosis not present

## 2016-10-24 DIAGNOSIS — Y638 Failure in dosage during other surgical and medical care: Secondary | ICD-10-CM | POA: Diagnosis not present

## 2016-10-24 DIAGNOSIS — Z79899 Other long term (current) drug therapy: Secondary | ICD-10-CM | POA: Diagnosis not present

## 2016-10-24 DIAGNOSIS — I10 Essential (primary) hypertension: Secondary | ICD-10-CM | POA: Diagnosis not present

## 2016-10-24 DIAGNOSIS — Y999 Unspecified external cause status: Secondary | ICD-10-CM | POA: Insufficient documentation

## 2016-10-24 DIAGNOSIS — T50905A Adverse effect of unspecified drugs, medicaments and biological substances, initial encounter: Secondary | ICD-10-CM

## 2016-10-24 LAB — URINALYSIS, MICROSCOPIC (REFLEX): SQUAMOUS EPITHELIAL / LPF: NONE SEEN

## 2016-10-24 LAB — BASIC METABOLIC PANEL
Anion gap: 13 (ref 5–15)
BUN: 11 mg/dL (ref 6–20)
CHLORIDE: 99 mmol/L — AB (ref 101–111)
CO2: 24 mmol/L (ref 22–32)
CREATININE: 0.69 mg/dL (ref 0.44–1.00)
Calcium: 9.7 mg/dL (ref 8.9–10.3)
GFR calc Af Amer: >60 mL/min (ref >60)
GFR calc non Af Amer: >60 mL/min (ref >60)
Glucose, Bld: 160 mg/dL — ABNORMAL HIGH (ref 65–99)
Potassium: 4.8 mmol/L (ref 3.5–5.1)
Sodium: 136 mmol/L (ref 135–145)

## 2016-10-24 LAB — CBC
HCT: 38.9 % (ref 36.0–46.0)
Hemoglobin: 13.1 g/dL (ref 12.0–15.0)
MCH: 30.3 pg (ref 26.0–34.0)
MCHC: 33.7 g/dL (ref 30.0–36.0)
MCV: 90 fL (ref 78.0–100.0)
PLATELETS: 251 10*3/uL (ref 150–400)
RBC: 4.32 MIL/uL (ref 3.87–5.11)
RDW: 12.7 % (ref 11.5–15.5)
WBC: 7.6 10*3/uL (ref 4.0–10.5)

## 2016-10-24 LAB — URINALYSIS, ROUTINE W REFLEX MICROSCOPIC
Bilirubin Urine: NEGATIVE
Glucose, UA: NEGATIVE mg/dL
Hgb urine dipstick: NEGATIVE
Ketones, ur: NEGATIVE mg/dL
Nitrite: NEGATIVE
Protein, ur: NEGATIVE mg/dL
Specific Gravity, Urine: 1.01 (ref 1.005–1.030)
pH: 8 (ref 5.0–8.0)

## 2016-10-24 LAB — DIFFERENTIAL
Basophils Absolute: 0 K/uL (ref 0.0–0.1)
Basophils Relative: 1 %
Eosinophils Absolute: 0.2 K/uL (ref 0.0–0.7)
Eosinophils Relative: 2 %
Lymphocytes Relative: 29 %
Lymphs Abs: 2.1 K/uL (ref 0.7–4.0)
Monocytes Absolute: 0.4 K/uL (ref 0.1–1.0)
Monocytes Relative: 5 %
Neutro Abs: 4.7 K/uL (ref 1.7–7.7)
Neutrophils Relative %: 63 %

## 2016-10-24 LAB — I-STAT TROPONIN, ED: Troponin i, poc: 0 ng/mL (ref 0.00–0.08)

## 2016-10-24 MED ORDER — SODIUM CHLORIDE 0.9 % IV BOLUS (SEPSIS)
1000.0000 mL | Freq: Once | INTRAVENOUS | Status: AC
  Administered 2016-10-24: 1000 mL via INTRAVENOUS

## 2016-10-24 MED ORDER — ONDANSETRON HCL 4 MG/2ML IJ SOLN
4.0000 mg | Freq: Once | INTRAMUSCULAR | Status: AC
  Administered 2016-10-24: 4 mg via INTRAVENOUS
  Filled 2016-10-24: qty 2

## 2016-10-24 NOTE — ED Provider Notes (Signed)
[facility]-EMERGENCY DEPT Provider Note   CSN: [id number] Arrival date & time: 10/24/16  1416     History   Chief Complaint Chief Complaint  Patient presents with  . Emesis    HPI Linda Wu is a 76 y.o. female.  HPI  76 year old female who presents with dizziness, nausea and vomiting. History is obtained through vietnamese speaking interpreter. She has a history of hypertension. States that she went to see her PCP yesterday with for generalized headache and neck discomfort. She was prescribed blood pressure medications as well as pain medications, including hydrochlorothiazide, atorvastatin, hydrocodone, and anti-inflammatory medication. States that she took all of these medications at once this morning around 8:00 and shortly afterwards began to feel sick. Occurred suddenly, unchanged since onset.  States that she had nausea and vomiting and dizziness, described as a sensation of lightheadedness. Denies any spinning sensation. Denies any speech changes, confusion, double vision, focal numbness or weakness. She did feel like her ambulation was unsteady, and walking make her dizziness worse. No alleviating factors. States that she has felt similar before when her blood pressure has been elevated.  Past Medical History:  Diagnosis Date  . Hypertension     There are no active problems to display for this patient.   Past Surgical History:  Procedure Laterality Date  . APPENDECTOMY      OB History    No data available       Home Medications    Prior to Admission medications   Medication Sig Start Date End Date Taking? Authorizing Provider  ibuprofen (ADVIL,MOTRIN) 200 MG tablet Take 200-400 mg by mouth every 6 (six) hours as needed for moderate pain.     [provider]  meclizine (ANTIVERT) 25 MG tablet Take 1 tablet (25 mg total) by mouth 3 (three) times daily as needed for dizziness. 09/18/15   Derwood KaplanNanavati, Ankit, MD  ondansetron (ZOFRAN ODT) 4 MG disintegrating tablet Take 1  tablet (4 mg total) by mouth every 8 (eight) hours as needed for nausea or vomiting. 09/18/15   Derwood KaplanNanavati, Ankit, MD  pravastatin (PRAVACHOL) 80 MG tablet Take 80 mg by mouth daily.    [provider]  traZODone (DESYREL) 100 MG tablet Take 100 mg by mouth at bedtime.    [provider]    Family History No family history on file.  Social History Social History  Substance Use Topics  . Smoking status: Never Smoker  . Smokeless tobacco: Not on file  . Alcohol use No     Allergies   Patient has no known allergies.   Review of Systems Review of Systems  Constitutional: Positive for fatigue. Negative for fever.  Respiratory: Negative for shortness of breath.   Cardiovascular: Negative for chest pain.  Gastrointestinal: Negative for abdominal pain.  Allergic/Immunologic: Negative for immunocompromised state.  Neurological: Positive for dizziness. Negative for headaches.  Hematological: Does not bruise/bleed easily.  Psychiatric/Behavioral: Negative for confusion.  All other systems reviewed and are negative.    Physical Exam Updated Vital Signs BP (!) 153/75   Pulse 79   Temp 98.5 F (36.9 C)   Resp 15   SpO2 99%   Physical Exam Physical Exam  Nursing note and vitals reviewed. Constitutional: Well developed, well nourished, non-toxic, and in no acute distress Head: Normocephalic and atraumatic.  Mouth/Throat: Oropharynx is clear and moist.  Neck: Normal range of motion. Neck supple.  Cardiovascular: Normal rate and regular rhythm.   Pulmonary/Chest: Effort normal and breath sounds normal.  Abdominal: Soft.  There is no tenderness. There is no rebound and no guarding.  Musculoskeletal: Normal range of motion.  Skin: Skin is warm and dry.  Psychiatric: Cooperative Neurological:  Alert, oriented to person, place, time, and situation. Memory grossly in tact. Fluent speech. No dysarthria or aphasia.  Cranial nerves: VF are full.Pupils are symmetric, and  reactive to light. EOMI without nystagmus. No gaze deviation. Facial muscles symmetric with activation. Sensation to light touch over face in tact bilaterally. Hearing grossly in tact. Palate elevates symmetrically. Head turn and shoulder shrug are intact. Tongue midline.  Reflexes defered.  Muscle bulk and tone normal. No pronator drift. Moves all extremities symmetrically. Sensation to light touch is in tact throughout in bilateral upper and lower extremities. Coordination reveals no dysmetria with finger to nose.   ED Treatments / Results  Labs (all labs ordered are listed, but only abnormal results are displayed) Labs Reviewed  BASIC METABOLIC PANEL - Abnormal; Notable for the following:       Result Value   Chloride 99 (*)    Glucose, Bld 160 (*)    All other components within normal limits  URINALYSIS, ROUTINE W REFLEX MICROSCOPIC - Abnormal; Notable for the following:    APPearance TURBID (*)    Leukocytes, UA TRACE (*)    All other components within normal limits  URINALYSIS, MICROSCOPIC (REFLEX) - Abnormal; Notable for the following:    Bacteria, UA MANY (*)    All other components within normal limits  URINE CULTURE  CBC  DIFFERENTIAL  I-STAT TROPOININ, ED    EKG  EKG Interpretation  Date/Time:  Wednesday October 24 2016 14:33:40 EDT Ventricular Rate:  71 PR Interval:  148 QRS Duration: 80 QT Interval:  406 QTC Calculation: 441 R Axis:   71 Text Interpretation:  Normal sinus rhythm RSR' or QR pattern in V1 suggests right ventricular conduction delay Nonspecific T wave abnormality Abnormal ECG similar to prior EKG Confirmed by Crista Curb [phone]) on 10/24/2016 4:46:32 PM       Radiology No results found.  Procedures Procedures (including critical care time)  Medications Ordered in ED Medications  sodium chloride 0.9 % bolus 1,000 mL (0 mLs Intravenous Stopped 10/24/16 1750)  ondansetron (ZOFRAN) injection 4 mg (4 mg Intravenous Given 10/24/16 1616)     Initial  Impression / Assessment and Plan / ED Course  I have reviewed the triage vital signs and the nursing notes.  Pertinent labs & imaging results that were available during my care of the patient were reviewed by me and considered in my medical decision making (see chart for details).     With weakness, nausea, vomiting, lightheadedness since taking medications which were newly prescribed for patient just yesterday. Prescribed HCTZ, NSAID, atorvastatin, and Hydrocodone. Suspect it was likely medication reaction. Vitals are stable. Neuro exam normal. Ambulatory in the ED. Blood work reassuring. EKG w/o ischemia and troponin normal. Not felt to be atypical ACS. Given IVF and antiemetics. Feels improved. Will discontinue hydrocodone, but have her continue HCTZ and atorvastatin. Strict return and follow-up instructions reviewed. She expressed understanding of all discharge instructions and felt comfortable with the plan of care.   Final Clinical Impressions(s) / ED Diagnoses   Final diagnoses:  Non-intractable vomiting with nausea, unspecified vomiting type  Adverse effect of drug, initial encounter    New Prescriptions Discharge Medication List as of 10/24/2016  7:29 PM       Lavera Guise, MD 10/24/16 2350

## 2016-10-24 NOTE — ED Notes (Signed)
Pt ambulatory to restroom, steady gait.

## 2016-10-24 NOTE — Discharge Instructions (Signed)
Please return for worsening symptoms, including confusion, difficulty walking, intractable vomiting, or any other symptoms concerning to you.  Please stop taking hydrocodone as it is likely making you sick. Please continue to take your blood pressure and cholesterol medication

## 2016-10-24 NOTE — ED Triage Notes (Signed)
Per family, interpreter, pt took blood pressure medicine and felt sick right after, around 2pm. Pt appears unwell, states she feels dizziness as well.

## 2016-10-24 NOTE — ED Notes (Signed)
EDP at bedside  

## 2016-10-24 NOTE — ED Notes (Signed)
ED Provider at bedside. 

## 2016-10-26 LAB — URINE CULTURE

## 2017-02-28 IMAGING — CT CT ANGIO NECK
2 of 11 series · 7 of 33 positions shown · IV contrast (isovue)
Comparison: None.

CLINICAL DATA: Dizziness and vertigo with neck pain. History of
hypertension.

EXAM:
CT ANGIOGRAPHY HEAD AND NECK
TECHNIQUE: Multidetector CT imaging of the head and neck was performed using
the standard protocol during bolus administration of intravenous
contrast. Multiplanar CT image reconstructions and MIPs were
obtained to evaluate the vascular anatomy. Carotid stenosis
measurements (when applicable) are obtained utilizing NASCET
criteria, using the distal internal carotid diameter as the
denominator.
CONTRAST:  80 cc Isovue 370

[Series 7: cta neck · axial · 0.49mm/px · z∈[-426,-84]mm · 3 of 172 slices shown]
[im 1/172  soft-tissue]
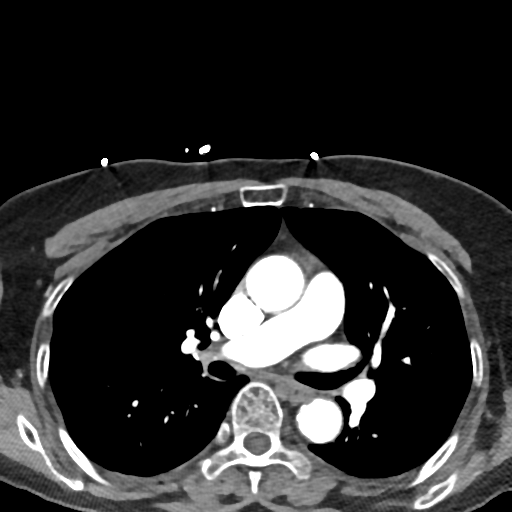
[im 86/172  bone]
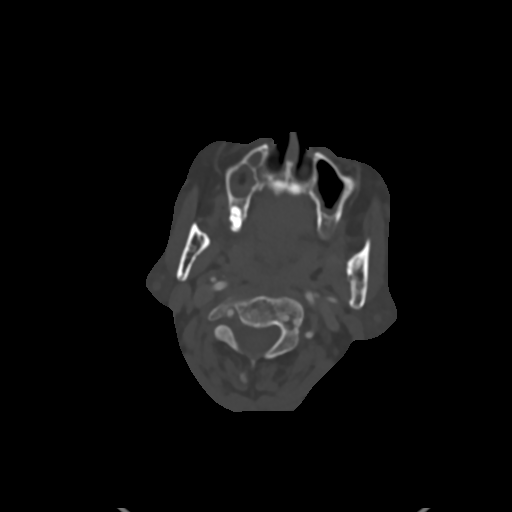
[im 172/172  soft-tissue]
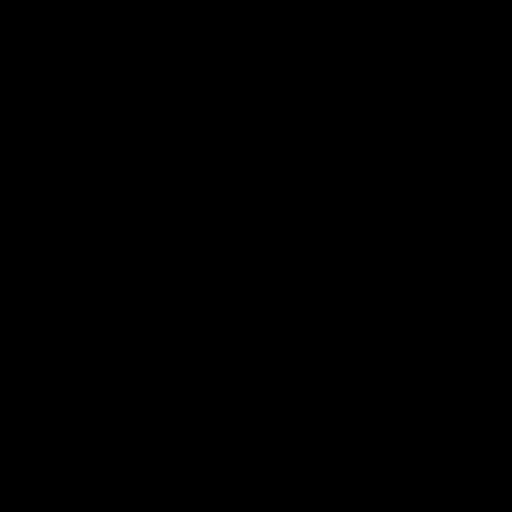

[Series 9: cta neck axial · axial · 0.39mm/px · z∈[-386,-188]mm · 4 of 340 slices shown]
[im 68/340  soft-tissue]
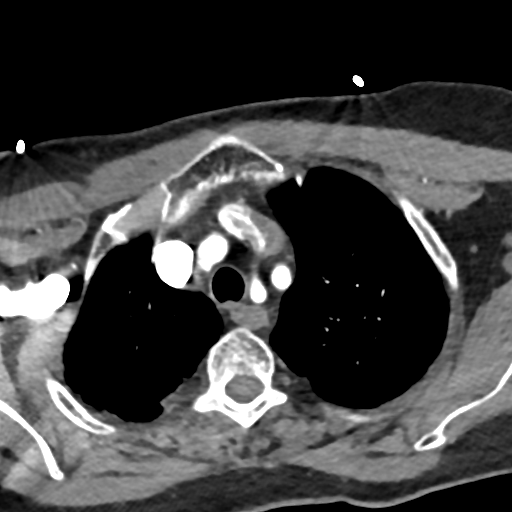
[im 136/340  soft-tissue]
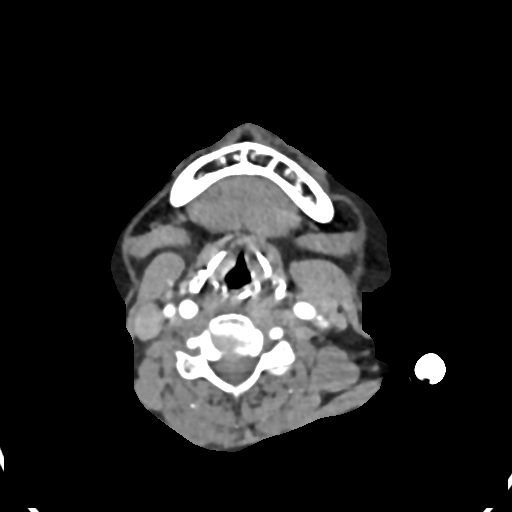
[im 204/340  soft-tissue]
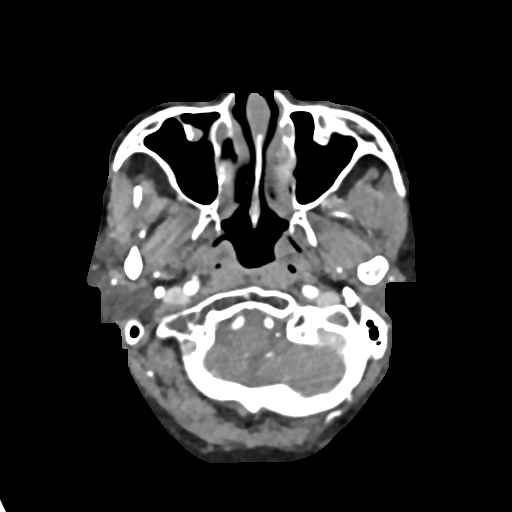
[im 272/340  soft-tissue]
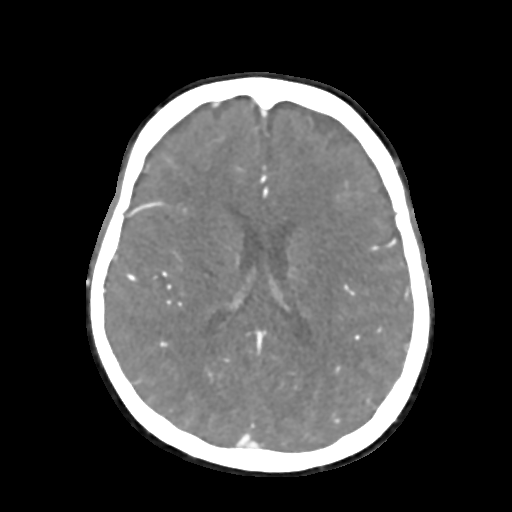

[7 of 33 positions shown; findings below may reference images not displayed]

FINDINGS: CT HEAD

INTRACRANIAL CONTENTS: The ventricles and sulci are normal for age.
No intraparenchymal hemorrhage, mass effect nor midline shift.
Minimal supratentorial white matter hypodensities are less than
expected for patient's age and though non-specific likely represent
chronic small vessel ischemic disease. No acute large vascular
territory infarcts. No abnormal extra-axial fluid collections. Basal
cisterns are patent. Moderate calcific atherosclerosis of the
carotid siphons. No abnormal intracranial enhancement.

ORBITS: The included ocular globes and orbital contents are
non-suspicious. Status post bilateral ocular lens implants.

SINUSES: Mild paranasal sinus mucosal thickening without air-fluid
levels. Mastoid air cells are well aerated.

SKULL/SOFT TISSUES: No skull fracture. No significant soft tissue
swelling.

CTA NECK

Mild motion degraded examination.

Aortic arch: Normal appearance of the thoracic arch, normal branch
pattern. Mild to moderate calcific atherosclerosis in intimal
thickening. The origins of the innominate, left Common carotid
artery and subclavian artery are widely patent.

Right carotid system: Common carotid artery is widely patent,
coursing in a straight line fashion. Normal appearance of the
carotid bifurcation without hemodynamically significant stenosis by
NASCET criteria. Mildly redundant RIGHT internal carotid artery
multiple folds can be seen with chronic hypertension.

Left carotid system: Common carotid artery is widely patent,
coursing in a straight line fashion. Normal appearance of the
carotid bifurcation without hemodynamically significant stenosis by
NASCET criteria. Normal appearance of the included internal carotid
artery.

Vertebral arteries:Left vertebral artery is dominant. Normal
appearance of the vertebral arteries, which appear widely patent.

Skeleton: No acute osseous process though bone windows have not been
submitted. Moderate to severe C6-7 disc height loss compatible with
degenerative disc.

Other neck: Soft tissues of the neck are non-acute though, not
tailored for evaluation.

CTA HEAD

Anterior circulation: Normal appearance of the cervical internal
carotid arteries, petrous, cavernous and supra clinoid internal
carotid arteries. Widely patent anterior communicating artery.
Normal appearance of the anterior and middle cerebral arteries.

Posterior circulation: Normal appearance of the vertebral arteries,
vertebrobasilar junction and basilar artery, as well as main branch
vessels. Normal appearance of the posterior cerebral arteries.

No large vessel occlusion, hemodynamically significant stenosis,
dissection, luminal irregularity, contrast extravasation or aneurysm
within the anterior nor posterior circulation.
IMPRESSION: CT HEAD: Negative head CT with and without contrast for age.

CTA NECK: Mildly motion degraded examination. No acute vascular
process or hemodynamically significant stenosis.

CTA HEAD:  Negative.

## 2018-04-29 ENCOUNTER — Other Ambulatory Visit: Payer: Self-pay | Admitting: Gastroenterology

## 2018-04-29 DIAGNOSIS — R1906 Epigastric swelling, mass or lump: Secondary | ICD-10-CM

## 2018-05-06 ENCOUNTER — Other Ambulatory Visit: Payer: Self-pay

## 2018-05-27 ENCOUNTER — Ambulatory Visit
Admission: RE | Admit: 2018-05-27 | Discharge: 2018-05-27 | Disposition: A | Discharge status: Home / Self Care | Payer: Medicaid Other | Source: Ambulatory Visit | Attending: Gastroenterology | Admitting: Gastroenterology

## 2018-05-27 DIAGNOSIS — R1906 Epigastric swelling, mass or lump: Secondary | ICD-10-CM

## 2019-11-10 IMAGING — US US ABDOMEN COMPLETE
1 series · 13 of 25 positions shown · non-contrast
Comparison: None.

CLINICAL DATA: Epigastric fullness.

EXAM:
ABDOMEN ULTRASOUND COMPLETE

[Series 1: us abdomen complete · 0.20mm/px · 13 of 83 slices shown]
[im 1/83]
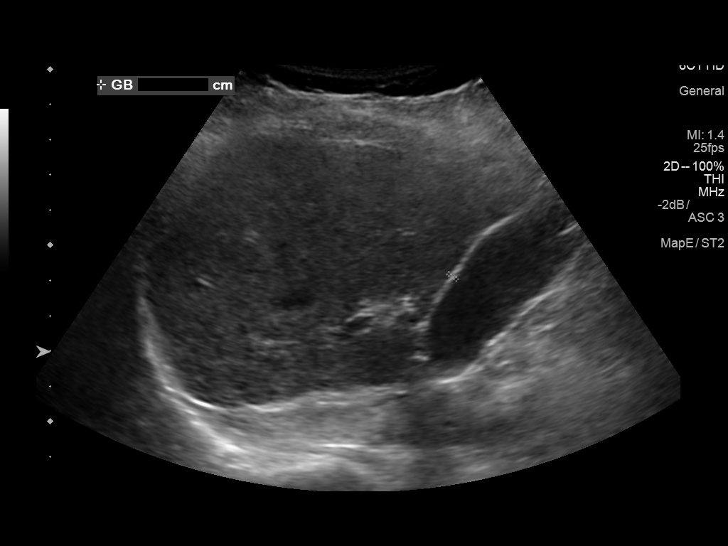
[im 7/83]
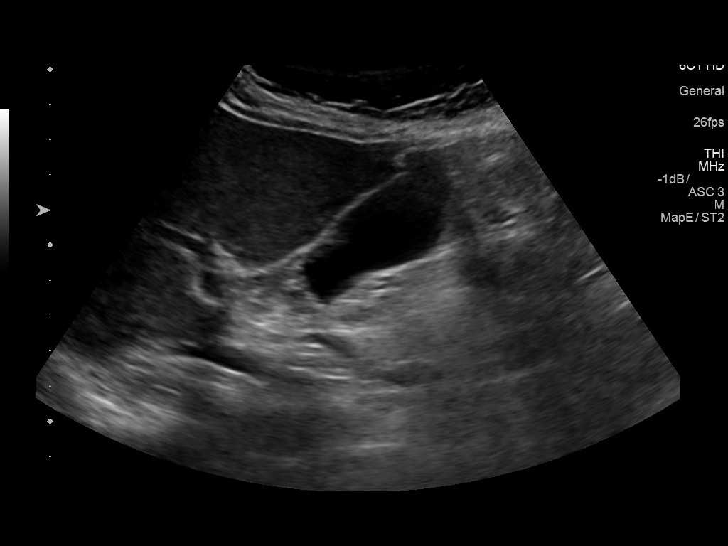
[im 14/83]
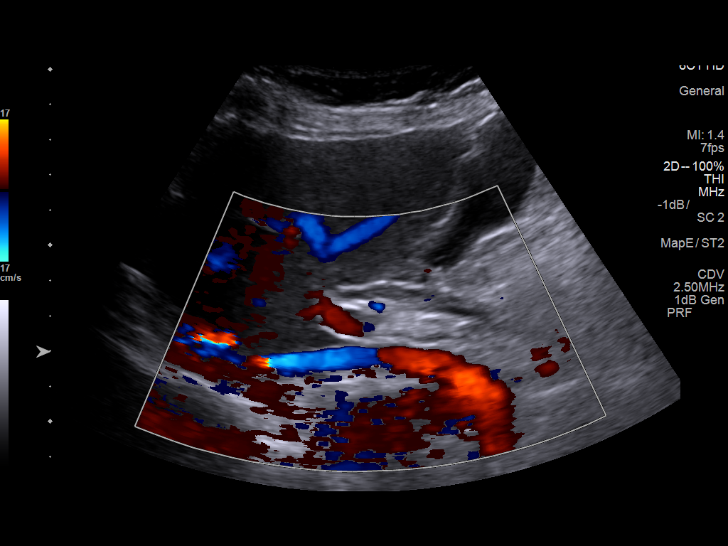
[im 21/83]
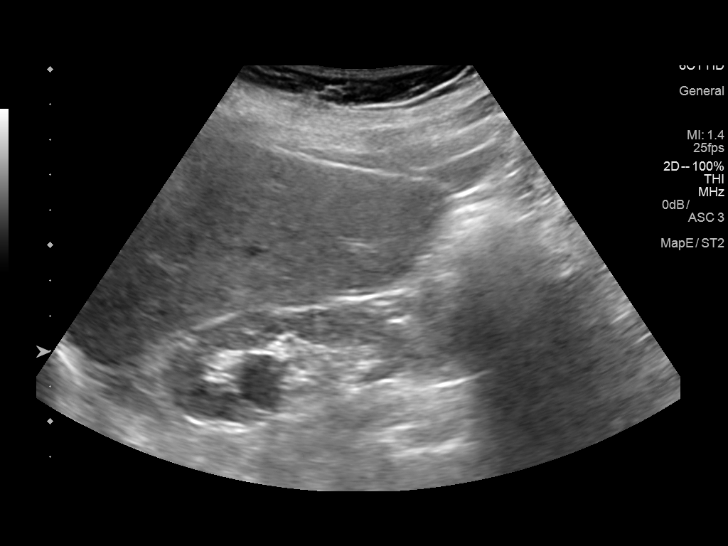
[im 28/83]
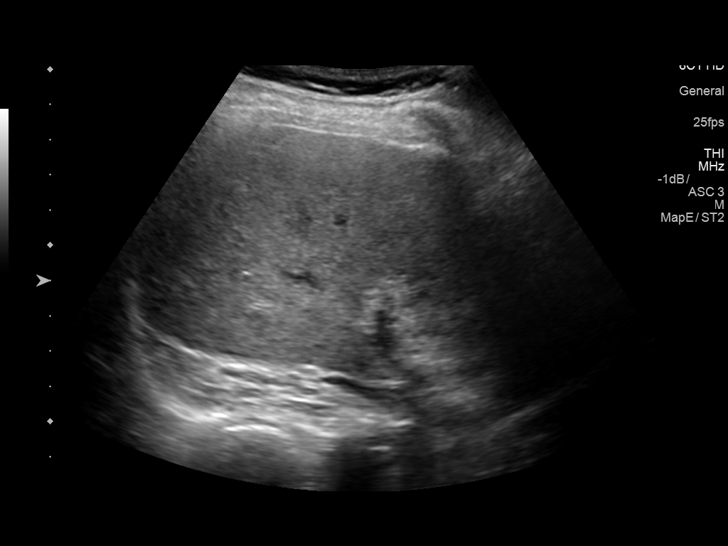
[im 35/83]
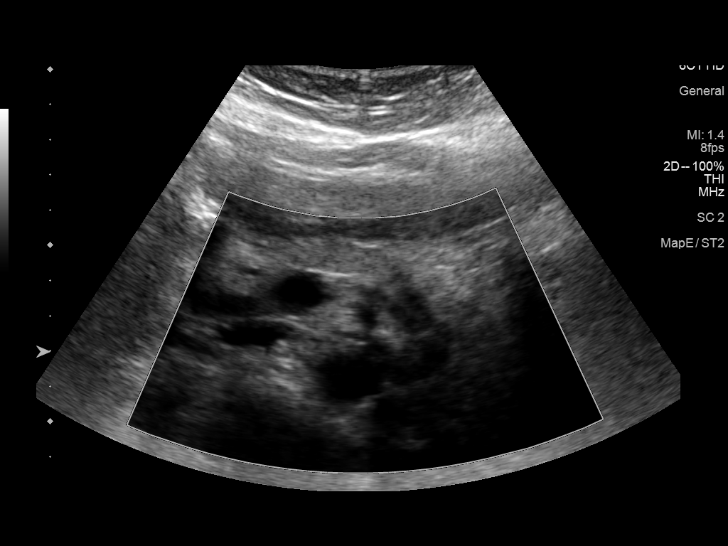
[im 42/83]
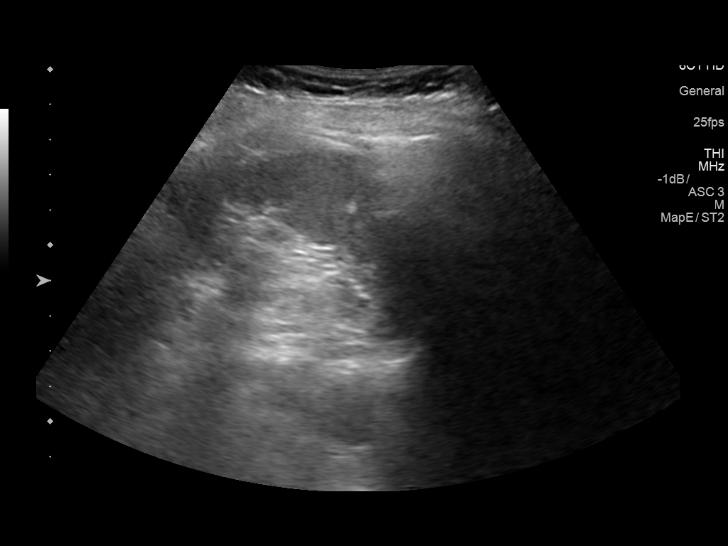
[im 48/83]
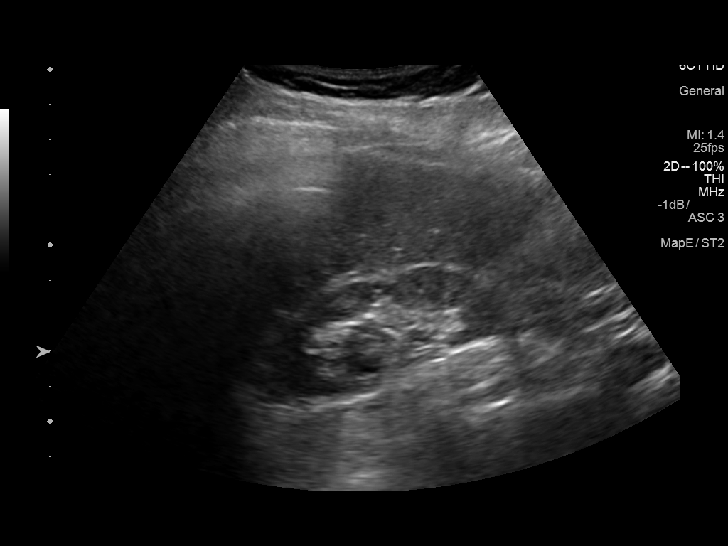
[im 55/83]
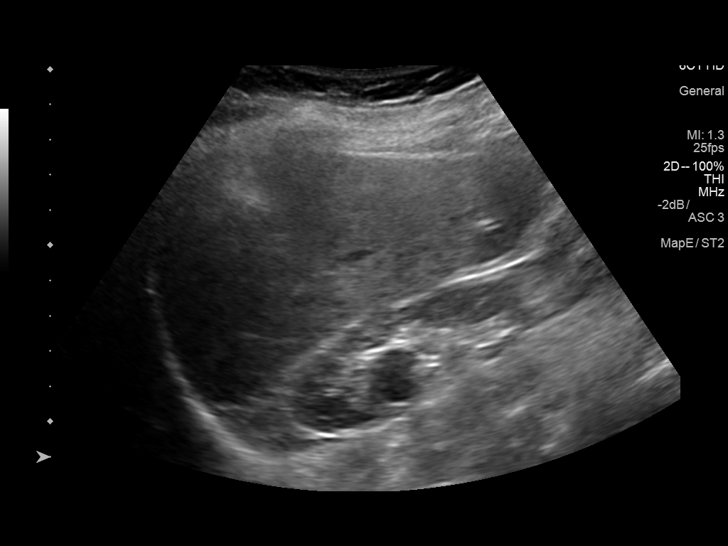
[im 62/83]
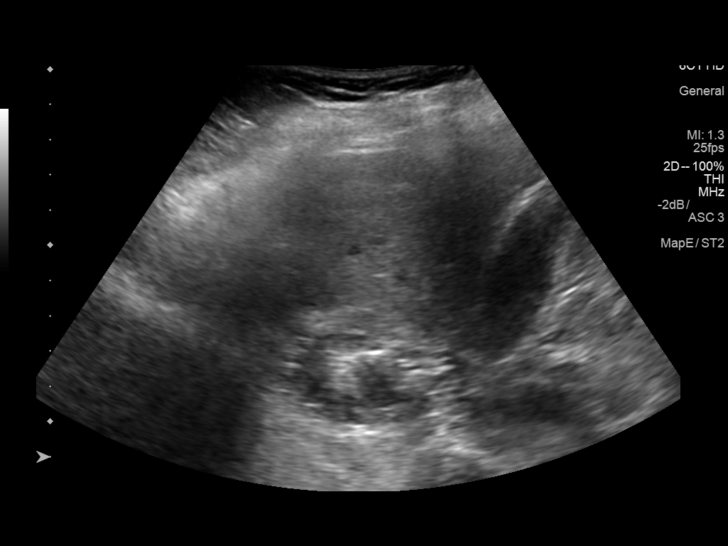
[im 69/83]
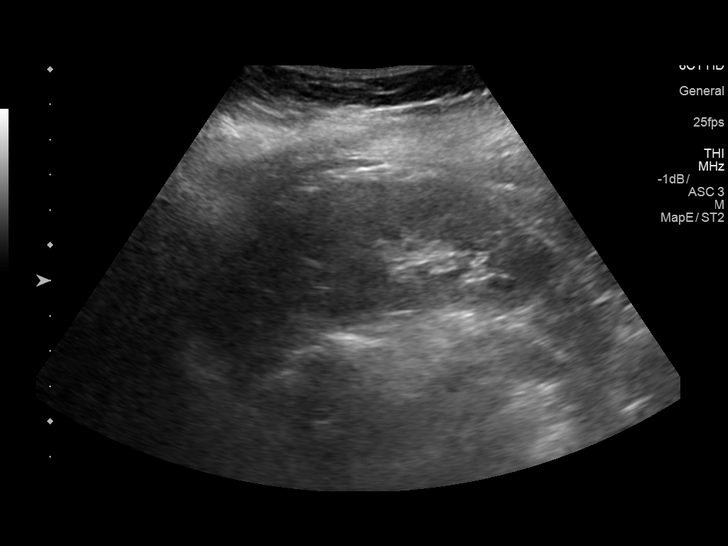
[im 76/83]
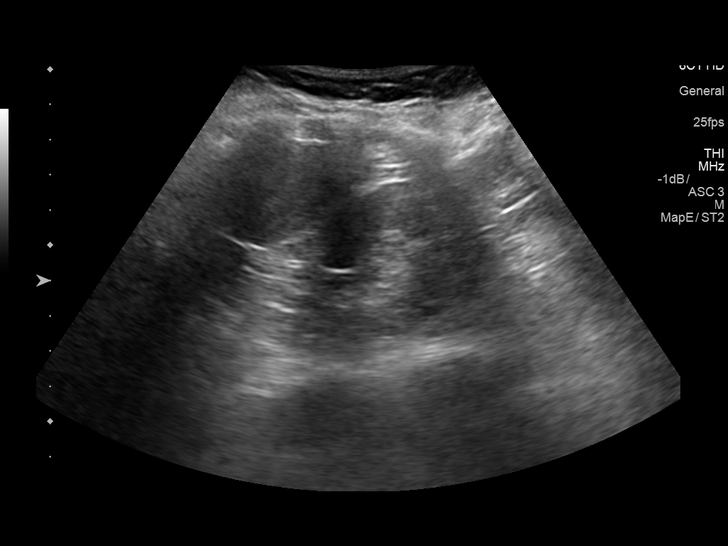
[im 83/83]
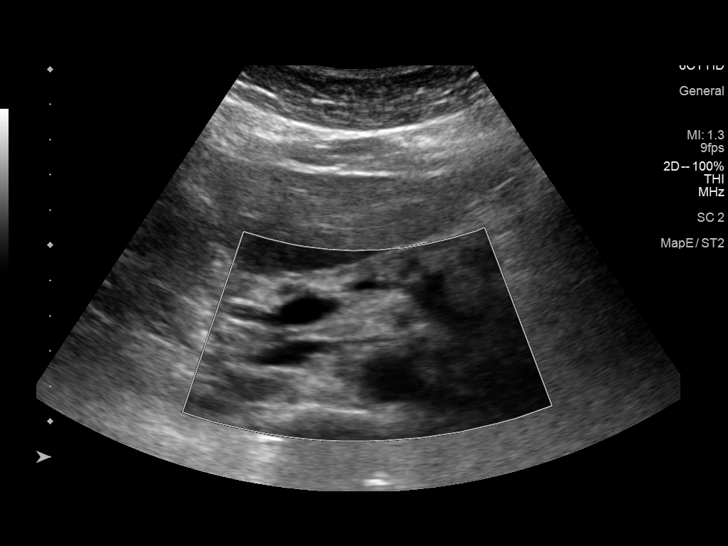

[13 of 25 positions shown; findings below may reference images not displayed]

FINDINGS: Gallbladder: No gallstones or wall thickening visualized. No
sonographic Murphy sign noted by sonographer.

Common bile duct: Diameter: 4.8 mm, within normal limits

Liver: Parenchymal echotexture is mildly increased. Internal
architecture is still discernible. No focal lesions are present.
Portal vein is patent on color Doppler imaging with normal direction
of blood flow towards the liver.

IVC: No abnormality visualized.

Pancreas: Visualized portion unremarkable.

Spleen: Size and appearance within normal limits.

Right Kidney: Length: 7.7 cm, somewhat small. There is thinning of
the renal parenchyma. Right kidney is isoechoic to the liver. A
central sinus cyst measures 1.5 cm maximally.

Left Kidney: Length: 10.1 cm, within normal limits. Echogenicity
within normal limits. No mass or hydronephrosis visualized.

Abdominal aorta: No aneurysm visualized.

Other findings: None.
IMPRESSION: 1. No acute or focal lesion to explain epigastric fullness.
2. Liver parenchyma is mildly hyperechoic, suggesting fatty
infiltration.
3. Asymmetric right-sided renal atrophy and mildly increased
echogenicity. This is chronic with some progression. Question
asymmetric vascular insufficiency or prior infection.
